# Patient Record
Sex: Female | Born: 1970 | Race: White | Hispanic: No | Marital: Married
Health system: Southern US, Community
[De-identification: ages and names within clinical notes are randomized; demographics above are authoritative.]

---

## 1998-07-09 ENCOUNTER — Inpatient Hospital Stay (HOSPITAL_COMMUNITY): Admission: AD | Admit: 1998-07-09 | Discharge: 1998-07-09 | Payer: Self-pay | Admitting: Obstetrics and Gynecology

## 1998-11-04 ENCOUNTER — Inpatient Hospital Stay (HOSPITAL_COMMUNITY): Admission: AD | Admit: 1998-11-04 | Discharge: 1998-11-04 | Payer: Self-pay | Admitting: Obstetrics and Gynecology

## 1999-01-14 ENCOUNTER — Inpatient Hospital Stay (HOSPITAL_COMMUNITY): Admission: AD | Admit: 1999-01-14 | Discharge: 1999-01-14 | Payer: Self-pay | Admitting: Obstetrics and Gynecology

## 1999-02-05 ENCOUNTER — Inpatient Hospital Stay (HOSPITAL_COMMUNITY): Admission: AD | Admit: 1999-02-05 | Discharge: 1999-02-08 | Payer: Self-pay | Admitting: Obstetrics and Gynecology

## 1999-03-02 ENCOUNTER — Other Ambulatory Visit: Admission: RE | Admit: 1999-03-02 | Discharge: 1999-03-02 | Payer: Self-pay | Admitting: Obstetrics and Gynecology

## 2000-01-27 ENCOUNTER — Ambulatory Visit (HOSPITAL_COMMUNITY): Admission: RE | Admit: 2000-01-27 | Discharge: 2000-01-27 | Payer: Self-pay | Admitting: Psychiatry

## 2000-02-03 ENCOUNTER — Ambulatory Visit (HOSPITAL_COMMUNITY): Admission: RE | Admit: 2000-02-03 | Discharge: 2000-02-03 | Payer: Self-pay | Admitting: Psychiatry

## 2000-02-29 ENCOUNTER — Ambulatory Visit (HOSPITAL_COMMUNITY): Admission: RE | Admit: 2000-02-29 | Discharge: 2000-02-29 | Payer: Self-pay | Admitting: Psychiatry

## 2000-03-28 ENCOUNTER — Ambulatory Visit (HOSPITAL_COMMUNITY): Admission: RE | Admit: 2000-03-28 | Discharge: 2000-03-28 | Payer: Self-pay | Admitting: Psychiatry

## 2000-07-18 ENCOUNTER — Ambulatory Visit (HOSPITAL_COMMUNITY): Admission: RE | Admit: 2000-07-18 | Discharge: 2000-07-18 | Payer: Self-pay | Admitting: Psychiatry

## 2000-07-26 ENCOUNTER — Ambulatory Visit (HOSPITAL_COMMUNITY): Admission: RE | Admit: 2000-07-26 | Discharge: 2000-07-26 | Payer: Self-pay | Admitting: Psychiatry

## 2000-07-27 ENCOUNTER — Other Ambulatory Visit: Admission: RE | Admit: 2000-07-27 | Discharge: 2000-07-27 | Payer: Self-pay | Admitting: Obstetrics and Gynecology

## 2000-07-30 ENCOUNTER — Ambulatory Visit (HOSPITAL_COMMUNITY): Admission: RE | Admit: 2000-07-30 | Discharge: 2000-07-30 | Payer: Self-pay | Admitting: Psychiatry

## 2002-01-14 ENCOUNTER — Other Ambulatory Visit: Admission: RE | Admit: 2002-01-14 | Discharge: 2002-01-14 | Payer: Self-pay | Admitting: Obstetrics and Gynecology

## 2002-09-11 ENCOUNTER — Inpatient Hospital Stay (HOSPITAL_COMMUNITY): Admission: AD | Admit: 2002-09-11 | Discharge: 2002-09-14 | Payer: Self-pay | Admitting: Obstetrics and Gynecology

## 2002-09-15 ENCOUNTER — Encounter: Admission: RE | Admit: 2002-09-15 | Discharge: 2002-10-15 | Payer: Self-pay | Admitting: Obstetrics and Gynecology

## 2002-10-14 ENCOUNTER — Other Ambulatory Visit: Admission: RE | Admit: 2002-10-14 | Discharge: 2002-10-14 | Payer: Self-pay | Admitting: Obstetrics and Gynecology

## 2002-10-16 ENCOUNTER — Encounter: Admission: RE | Admit: 2002-10-16 | Discharge: 2002-11-15 | Payer: Self-pay | Admitting: Obstetrics and Gynecology

## 2003-10-05 ENCOUNTER — Other Ambulatory Visit: Admission: RE | Admit: 2003-10-05 | Discharge: 2003-10-05 | Payer: Self-pay | Admitting: Obstetrics and Gynecology

## 2004-04-01 ENCOUNTER — Encounter: Admission: RE | Admit: 2004-04-01 | Discharge: 2004-04-01 | Payer: Self-pay | Admitting: Family Medicine

## 2004-09-27 ENCOUNTER — Ambulatory Visit: Payer: Self-pay | Admitting: Family Medicine

## 2004-09-29 ENCOUNTER — Ambulatory Visit: Payer: Self-pay

## 2004-10-19 ENCOUNTER — Ambulatory Visit: Payer: Self-pay | Admitting: Internal Medicine

## 2004-12-01 ENCOUNTER — Ambulatory Visit: Payer: Self-pay | Admitting: Internal Medicine

## 2004-12-08 ENCOUNTER — Encounter (INDEPENDENT_AMBULATORY_CARE_PROVIDER_SITE_OTHER): Payer: Self-pay | Admitting: Specialist

## 2004-12-08 ENCOUNTER — Ambulatory Visit: Payer: Self-pay | Admitting: Internal Medicine

## 2005-04-04 ENCOUNTER — Other Ambulatory Visit: Admission: RE | Admit: 2005-04-04 | Discharge: 2005-04-04 | Payer: Self-pay | Admitting: Obstetrics and Gynecology

## 2005-10-19 ENCOUNTER — Other Ambulatory Visit: Admission: RE | Admit: 2005-10-19 | Discharge: 2005-10-19 | Payer: Self-pay | Admitting: Obstetrics and Gynecology

## 2006-09-03 ENCOUNTER — Ambulatory Visit: Payer: Self-pay | Admitting: Internal Medicine

## 2006-09-03 LAB — CONVERTED CEMR LAB
AST: 19 units/L (ref 0–37)
Albumin: 3.5 g/dL (ref 3.5–5.2)
Basophils Relative: 0.9 % (ref 0.0–1.0)
Bilirubin, Direct: 0.1 mg/dL (ref 0.0–0.3)
CRP, High Sensitivity: 7 — ABNORMAL HIGH (ref 0.00–5.00)
HCT: 37.3 % (ref 36.0–46.0)
IgA: 159 mg/dL (ref 68–378)
Lymphocytes Relative: 23.6 % (ref 12.0–46.0)
MCV: 85.8 fL (ref 78.0–100.0)
Monocytes Absolute: 0.4 10*3/uL (ref 0.2–0.7)
Neutro Abs: 8 10*3/uL — ABNORMAL HIGH (ref 1.4–7.7)
Platelets: 354 10*3/uL (ref 150–400)
RDW: 12.2 % (ref 11.5–14.6)
Total Bilirubin: 0.5 mg/dL (ref 0.3–1.2)

## 2006-09-05 ENCOUNTER — Ambulatory Visit (HOSPITAL_COMMUNITY): Admission: RE | Admit: 2006-09-05 | Discharge: 2006-09-05 | Payer: Self-pay | Admitting: Internal Medicine

## 2007-08-20 IMAGING — NM NM HEPATO W/GB/PHARM/[PERSON_NAME]
2 series · 12 of 12 positions shown · non-contrast
Comparison: None.

CLINICAL DATA: Epigastric pain and diarrhea.
 NUCLEAR MEDICINE HEPATOBILIARY SCAN WITH EJECTION FRACTION:
TECHNIQUE: Sequential abdominal images were obtained following intravenous injection of radiopharmaceutical.  Sequential images were continued following oral ingestion of 8 oz. half-and-half, and the gallbladder ejection fraction was calculated.
 Radiopharmaceutical:  5.2 mCi 8c-00m Choletec intravenously.  8 oz half-and-half PO.

[hepato · 2.40mm/px · 6 of 60 frames shown (1 of 2)]
[frame 6/60]
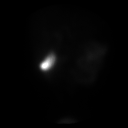
[frame 16/60]
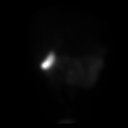
[frame 26/60]
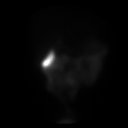
[frame 36/60]
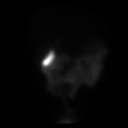
[frame 46/60]
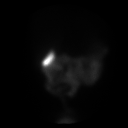
[frame 56/60]
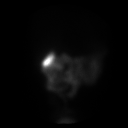

[hepato · 2.40mm/px · 6 of 60 frames shown (2 of 2)]
[frame 6/60]
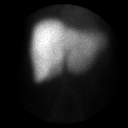
[frame 16/60]
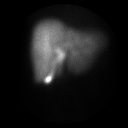
[frame 26/60]
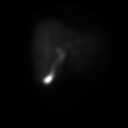
[frame 36/60]
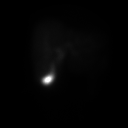
[frame 46/60]
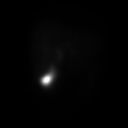
[frame 56/60]
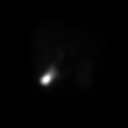

[12 of 12 positions shown; findings below may reference images not displayed]

FINDINGS: Uniform uptake is seen in the liver. The gallbladder is visualized at 15 minutes. Bowel is seen at 45 minutes. Gallbladder ejection fraction is 34% (normal is greater than 50%).
IMPRESSION: Low gallbladder ejection fraction.

## 2008-10-07 ENCOUNTER — Ambulatory Visit: Payer: Self-pay | Admitting: Internal Medicine

## 2008-10-07 ENCOUNTER — Telehealth: Payer: Self-pay | Admitting: Internal Medicine

## 2008-10-07 DIAGNOSIS — R1013 Epigastric pain: Secondary | ICD-10-CM

## 2008-10-07 DIAGNOSIS — R079 Chest pain, unspecified: Secondary | ICD-10-CM

## 2008-10-07 DIAGNOSIS — R197 Diarrhea, unspecified: Secondary | ICD-10-CM

## 2008-10-08 ENCOUNTER — Encounter: Payer: Self-pay | Admitting: Internal Medicine

## 2008-10-08 ENCOUNTER — Ambulatory Visit: Payer: Self-pay | Admitting: Internal Medicine

## 2008-10-08 LAB — CONVERTED CEMR LAB
Amylase: 105 units/L (ref 27–131)
BUN: 13 mg/dL (ref 6–23)
Basophils Absolute: 0.5 10*3/uL — ABNORMAL HIGH (ref 0.0–0.1)
Basophils Relative: 4.8 % — ABNORMAL HIGH (ref 0.0–3.0)
CO2: 27 meq/L (ref 19–32)
Eosinophils Absolute: 0.1 10*3/uL (ref 0.0–0.7)
Eosinophils Relative: 0.8 % (ref 0.0–5.0)
Glucose, Bld: 83 mg/dL (ref 70–99)
HCT: 37.7 % (ref 36.0–46.0)
Hemoglobin: 13.2 g/dL (ref 12.0–15.0)
MCV: 86.1 fL (ref 78.0–100.0)
Monocytes Absolute: 0.6 10*3/uL (ref 0.1–1.0)
Neutro Abs: 4.9 10*3/uL (ref 1.4–7.7)
Potassium: 4.4 meq/L (ref 3.5–5.1)

## 2010-11-25 NOTE — Discharge Summary (Signed)
[name], SACHS NO.:  [id number]   MEDICAL RECORD NO.:  [id number]                   PATIENT TYPE:  INP   LOCATION:  9143                                 FACILITY:  [facility]   PHYSICIAN:  Dineen Kid. Rana Snare, M.D.                 DATE OF BIRTH:  [birth year]   DATE OF ADMISSION:  09/11/2002  DATE OF DISCHARGE:  09/14/2002                                 DISCHARGE SUMMARY   ADMITTING DIAGNOSES:  1. Intrauterine pregnancy at 25 and 5/7 weeks estimated gestational age.  2. Advanced cervical dilatation.  3. Malpresentation with double footling breech.   DISCHARGE DIAGNOSES:  1. Status post low transverse cesarean section.  2. Viable female infant.   PROCEDURE:  Primary low transverse cesarean section.   REASON FOR ADMISSION:  Please see dictated H&P.   HOSPITAL COURSE:  The patient was a 40 year old gravida 2, para 1 at 37 and  5/7 weeks who was seen in the office on the previous day and was noted to be  5-6 cm dilated with a bulging bag of water.  The patient was not in labor;  however, she was noted to have a compound presentation.  Decision was made  for patient to arrive at the hospital on the following day with artificial  rupture of membranes and possible reduction of the arm under epidural  anesthesia with the possibility of a cesarean delivery.  On the a.m. of  admission patient was taken to the operating room where epidural was placed  without difficulty.  The patient was prepped for cesarean delivery.  Cervix  was now 6-7 cm dilated with a bulging bag of water.  After careful  examination double footling breech presentation was noted and decision was  made to proceed with cesarean delivery.  A low transverse incision was made  with the delivery of a viable female infant weighing 6 pounds 8 ounces with  Apgars of 7 at one minute, 8 at five minutes.  The patient tolerated  procedure well and was taken to the recovery room in stable condition.  On  postoperative day one patient was doing well.  Abdomen was soft.  Fundus was  firm and nontender.  She had good return of bowel function.  Abdominal  dressing was clean, dry, and intact.  Baby was in the NICU having some  respiratory difficulty.  Laboratories revealed hemoglobin 9.7, WBC count  14.5, and platelet count of 223,000.  On postoperative day two abdominal  dressing was removed revealing incision that was clean, dry, and intact.  The patient was tolerating a regular diet without complaints of nausea and  vomiting.  Vital signs were stable.  Fundus was firm and nontender.  On  postoperative day three patient was doing well.  Fundus was firm and  nontender.  Incision was clean, dry, and intact.  Staples were removed and  patient was discharged home.  CONDITION ON DISCHARGE:  Good.   DIET:  Regular, as tolerated.   ACTIVITY:  No heavy lifting.  No driving x2 weeks.  No vaginal entry.   FOLLOW UP:  The patient is to follow up in the office in one to two weeks  for an incision check.  She is to call for temperature greater than 100  degrees, persistent nausea and vomiting, heavy vaginal bleeding, and/or  redness or drainage from the incisional site.   DISCHARGE MEDICATIONS:  1. Tylox numbers 30 one p.o. q.4-6h. p.r.n.  2. Motrin 600 mg q.6h. p.r.n.  3. Prenatal vitamins one p.o. daily.  4. Colace one p.o. daily p.r.n.     Julio Sicks, N.P.                        Dineen Kid Rana Snare, M.D.    CC/MEDQ  D:  10/01/2002  T:  10/01/2002  Job:  [id number]

## 2010-11-25 NOTE — Assessment & Plan Note (Signed)
[facility]                         GASTROENTEROLOGY OFFICE NOTE   NAME:FERGUSONCheyenna, [name]                       MRN:          [id number]  DATE:09/03/2006                            DOB:          09-14-1970    REFERRING PHYSICIAN:  Tammy R. Collins [name], M.D.   REASON FOR CONSULTATION:  Abdominal pain.   ASSESSMENT:  1. Epigastric pain, intermittent, not totally responsive to proton      pump inhibitor therapy, ultrasound unrevealing, Helicobacter pylori      testing negative per patient.  2. Persistent diarrhea symptoms.  Previous workup in 2006 with      terminal ileum inspection and colonoscopy and biopsies did not      reveal any problems.  She had tissue transglutaminase antibody that      was negative for sprue.  Working diagnosis has been irritable bowel      syndrome.   RECOMMENDATIONS AND PLAN:  1. Check hepatic function panel, amylase, lipase, sed rate, C-reactive      protein, and a CBC with differential, as well as stool for      WBC/fecal lactoferrin.  I am trying to sort out if there is an      inflammatory process or not.  2. Schedule HIDA scan with ejection fraction as gallbladder dyskinesia      could cause some of these symptoms.  3. Try to take 20 mg of Bentyl at a time (she did have some limited      benefit with this) if she gets a spell again.  4. Could require an upper GI endoscopy.  I went ahead and checked an      IgA level as that was not done previously and some celiac patient's      are IgA deficient and that could by why the tissue transglutaminase      antibody is negative.  Other investigative options would be small-      bowel follow-through, versus capsule endoscopy, versus CT      enterography.  5. A therapeutic trial of Probiotic could be used as well.  I will      call her with the results of her studies and the next step if      necessary.   HISTORY:  This is a 40 year old white woman that I know from previous  evaluation as described above.  She has continued to have some diarrhea  and occasional fecal incontinence.  When she presented in 2006, she was  complaining of diarrhea and urge incontinence issues.  We had considered  but did not try antibiotics or probiotic.  She has avoided milk  products.  She has 3-4 loose bowel movements that occur in spells and  she can have some incontinence.  She did have some rectal bleeding.  She  had hemorrhoids on her colonoscopy.  In the Fall, she started having  problems with epigastric discomfort without any particular precipitant.  There was no particular food, she could eat spicy food or greasy food  (rarely does).  She will get a spell of a pressure and a bloating  sensation  and a worsening pain in the epigastrium that seems to radiate  down to the umbilicus and out into the lower quadrants.  Bowel movements or flatus do not seem to help this.  She was initially  put on Prevacid in December which helped the epigastric pain.  Formulary  change to Prilosec then ensued and she felt like that was helping her  abdominal discomfort but she continues to have some spells.  Her most  recent one was 3 days ago that lasted for several hours.  Bentyl at 10  mg seemed to help for a few minutes, maybe 10 minutes or so, but she  could not get a prolonged effect.  She is not having complaints of  bleeding or fever or chills.  Her weight is relatively stable to  increased.  Stress level is unchanged.  She did change from Paxil to  Zoloft in December, after these symptoms had already started as the  Paxil did not seem to be helping her depression any more.   PAST MEDICAL HISTORY:  As above, plus depression.   MEDICATIONS:  1. Zoloft 100 mg daily.  2. Multivitamin daily.  3. Prilosec 20 mg daily.  4. Yasmin birth control, as directed.  5. Bentyl 10 mg as needed.  6. Tylenol p.r.n.   Note, Levsin has not helped in the past.   SOCIAL HISTORY:  She is a Restaurant manager, fast food.  She  has been a Engineer, civil (consulting).  She stays at home  at this time.  She is not a smoker.   REVIEW OF SYSTEMS:  As above.   FAMILY HISTORY:  Her maternal grandfather had ulcerative colitis.  Her  father has colon polyps as well as maternal grandparents.  No colon  cancer.   PHYSICAL EXAMINATION:  GENERAL:  Reveals a well developed, young, white  woman in no acute distress.  VITAL SIGNS:  Weight 123 pounds, pulse 80, blood pressure 108/70, height  5 feet 5 inches.  EYES:  Anicteric.  NECK:  Supple.  CHEST:  Clear.  HEART:  S1 S2.  No murmurs or gallops.  ABDOMEN:  Soft.  Mildly tender in the periumbilical and lower quadrant  area without organomegaly or mass.  SKIN:  Warm and dry without rash in the areas inspected.  NEUROLOGIC:  She is alert and oriented x 3.   Ultrasound report is reviewed and did show mild hepatomegaly.  I doubt  this is clinically significant.  I cannot palpate her liver.  She could  potentially have a Riedel lobe.  They said it was 16.5-cm in the mid  clavicular length and so that is one possibility on the ultrasound and I  doubt that would have any relationship to these symptoms from what I  know.  This was discussed with the patient and we will keep that in mind  but I do not think any further evaluation of that is necessary.  I will  await the lab tests.   I appreciate the opportunity to care for this patient.     Iva Boop, MD,FACG  Electronically Signed    CEG/MedQ  DD: 09/03/2006  DT: 09/03/2006  Job #: [id number]   cc:   [name], M.D.

## 2010-11-25 NOTE — Op Note (Signed)
[name], [name] NO.:  [id number]   MEDICAL RECORD NO.:  [id number]                   PATIENT TYPE:  INP   LOCATION:  9198                                 FACILITY:  [facility]   PHYSICIAN:  Michelle L. Vincente Poli, M.D.            DATE OF BIRTH:  08-Feb-1971   DATE OF PROCEDURE:  09/11/2002  DATE OF DISCHARGE:                                 OPERATIVE REPORT   PREOPERATIVE DIAGNOSES:  1. Intrauterine pregnancy at 31 and 5/7 weeks.  2. Advanced cervical dilation.  3. Malpresentation with double footling breech.   POSTOPERATIVE DIAGNOSES:  1. Intrauterine pregnancy at 68 and 5/7 weeks.  2. Advanced cervical dilation.  3. Malpresentation with double footling breech.   PROCEDURE:  Primary low transverse cesarean section.   SURGEON:  Michelle L. Vincente Poli, M.D.   ANESTHESIA:  Epidural.   ESTIMATED BLOOD LOSS:  500 mL.   FINDINGS:  Female infant with Apgars 7 at one minute, 8 at five minutes with a  double footling breech and weighing 6 pounds 8 ounces.   PROCEDURE:  The patient was taken to the operating room.  After her epidural  was dosed her abdomen was prepped in the usual sterile fashion.  The Foley  catheter was inserted into the bladder.  Examination under anesthesia  revealed that she was 6-7 cm dilated and there was a bulging bag.  Initially  I could feel the baby's arm but I could not feel another presenting part.  With further examination I did feel two feet just below the pubic symphysis  and given this is a malpresentation we then proceeded with cesarean section.  A sterile drape was applied and a low transverse incision was made and  carried down to the fascia.  The fascia was scored in the midline and  extended laterally.  The Pfannenstiel incision was developed and the rectus  muscles were separated in the midline.  The peritoneum was entered bluntly  and the peritoneal incision was then stretched.  The bladder blade was  inserted.  The  lower uterine segment was identified and the bladder flap was  created sharply and then digitally and the bladder blade was then  readjusted.  A low transverse incision was made in the uterus and the baby  was noted to be double footling breech and was delivered by complete breech  extraction without difficulty.  The baby was a female infant with Apgars 7 at  one minute, 8 at five minutes and weight 6 pounds 9 ounces.  Cord blood was  obtained.  The placenta was manually removed, noted to be normal and intact.  Uterus was cleared of all clots and debris.  The uterus was exteriorized and  closed.  The uterine incision was closed in a single layer using 0 chromic  in a continuous running locked fashion.  It was hemostatic.  The uterus was  returned to the abdomen.  Irrigation was performed.  The incision was then  reinspected and noted to be hemostatic.  The peritoneum was closed with an 0  Vicryl in a continuous running stitch and the rectus muscles were  reapproximated using the same 0 Vicryl.  The fascia was  closed using 0 Vicryl in a continuous running stitch starting at each corner  and meeting in the midline.  After irrigation of the subcutaneous layer the  skin was closed with staples.  All sponge, lap, instrument counts were  correct x2.  The patient tolerated procedure well and went to recovery room  in stable condition.                                               Michelle L. Vincente Poli, M.D.    Florestine Avers  D:  09/11/2002  T:  09/11/2002  Job:  [id number]

## 2010-11-25 NOTE — H&P (Signed)
   [name], [name] NO.:  [id number]   MEDICAL RECORD NO.:  [id number]                   PATIENT TYPE:  INP   LOCATION:  9198                                 FACILITY:  [facility]   PHYSICIAN:  Michelle L. Vincente Poli, M.D.            DATE OF BIRTH:  1971/05/06   DATE OF ADMISSION:  09/11/2002  DATE OF DISCHARGE:                                HISTORY & PHYSICAL   HISTORY OF PRESENT ILLNESS:  This patient is a 40 year old gravida 2, para 1  at 36 weeks and 5 days who presents to the hospital.  Yesterday in the  office she was noted to be 5-6 cm dilated with bulging bag and not in labor.  At that time in the office she was noted to have a compound presentation  with one of the baby's arms completely extended above the head.  She is  admitted to undergo an epidural and then an examination under anesthesia and  attempted AROM and reduction of the arm.  The patient will be prepped for  cesarean section in the event there is a cord prolapse or malpresentation  that persists after AROM.  Her prenatal care has been uncomplicated.  She  has a history of group B Strep previous pregnancy and a history of preterm  labor.   PHYSICAL EXAMINATION:  VITAL SIGNS:  Afebrile with stable vital signs.  LUNGS:  Clear to auscultation bilaterally.  CARDIAC:  Regular rate and rhythm.  ABDOMEN:  Gravid.  Leopold's was about 6.5 pounds.  PELVIC:  Vaginal examination as described above.   IMPRESSION:  1. Intrauterine pregnancy at 15 and 5/7 weeks.  2. Advanced cervical dilation.  3. Malpresentation.   PLAN:  Go to the operating room.  Have an epidural placed and examination  under anesthesia and possible AROM after this.  Risks and benefits were  reviewed with the patient and she is well aware that she may need a cesarean  section at the time of examination.                                               Michelle L. Vincente Poli, M.D.    [name]  D:  09/11/2002  T:  09/11/2002   Job:  [id number]

## 2015-01-20 ENCOUNTER — Other Ambulatory Visit: Payer: Self-pay

## 2015-01-21 LAB — CYTOLOGY - PAP

## 2015-06-07 ENCOUNTER — Telehealth (HOSPITAL_COMMUNITY): Payer: Self-pay

## 2015-06-07 NOTE — Telephone Encounter (Signed)
error 

## 2015-07-19 ENCOUNTER — Telehealth (HOSPITAL_COMMUNITY): Payer: Self-pay | Admitting: *Deleted

## 2015-07-19 NOTE — Telephone Encounter (Signed)
Called patient in follow-up to her triage call over the weekend. She had reported generalized swelling and weight gain since on chemo. She reports that she felt better after the explanation provided by oncall oncologist that this could be cumulative from the steroids that she is intermittently on. She has continued taking the senna kot and a offbrand laxative with good results to her bowels. Constipation regimen discussed.
